# Patient Record
Sex: Male | Born: 1985 | Race: Black or African American | Hispanic: No | Marital: Single | Smoking: Current every day smoker
Health system: Southern US, Community
[De-identification: ages and names within clinical notes are randomized; demographics above are authoritative.]

## PROBLEM LIST (undated history)

## (undated) DIAGNOSIS — K219 Gastro-esophageal reflux disease without esophagitis: Secondary | ICD-10-CM

## (undated) HISTORY — DX: Gastro-esophageal reflux disease without esophagitis: K21.9

---

## 2017-06-27 ENCOUNTER — Emergency Department (HOSPITAL_COMMUNITY): Payer: No Typology Code available for payment source

## 2017-06-27 ENCOUNTER — Other Ambulatory Visit: Payer: Self-pay

## 2017-06-27 ENCOUNTER — Encounter (HOSPITAL_COMMUNITY): Payer: Self-pay

## 2017-06-27 ENCOUNTER — Emergency Department (HOSPITAL_COMMUNITY)
Admission: EM | Admit: 2017-06-27 | Discharge: 2017-06-27 | Disposition: A | Discharge status: Home / Self Care | Payer: No Typology Code available for payment source | Attending: Emergency Medicine | Admitting: Emergency Medicine

## 2017-06-27 DIAGNOSIS — Y999 Unspecified external cause status: Secondary | ICD-10-CM | POA: Diagnosis not present

## 2017-06-27 DIAGNOSIS — M25561 Pain in right knee: Secondary | ICD-10-CM | POA: Insufficient documentation

## 2017-06-27 DIAGNOSIS — S161XXA Strain of muscle, fascia and tendon at neck level, initial encounter: Secondary | ICD-10-CM

## 2017-06-27 DIAGNOSIS — S39012A Strain of muscle, fascia and tendon of lower back, initial encounter: Secondary | ICD-10-CM | POA: Diagnosis not present

## 2017-06-27 DIAGNOSIS — Y929 Unspecified place or not applicable: Secondary | ICD-10-CM | POA: Insufficient documentation

## 2017-06-27 DIAGNOSIS — Y939 Activity, unspecified: Secondary | ICD-10-CM | POA: Diagnosis not present

## 2017-06-27 DIAGNOSIS — S199XXA Unspecified injury of neck, initial encounter: Secondary | ICD-10-CM | POA: Diagnosis present

## 2017-06-27 MED ORDER — METHOCARBAMOL 500 MG PO TABS
500.0000 mg | ORAL_TABLET | Freq: Once | ORAL | Status: AC
  Administered 2017-06-27: 500 mg via ORAL
  Filled 2017-06-27: qty 1

## 2017-06-27 MED ORDER — METHOCARBAMOL 500 MG PO TABS: 500.0000 mg | ORAL_TABLET | Freq: Two times a day (BID) | ORAL | 0 refills | Status: DC

## 2017-06-27 MED ORDER — HYDROCODONE-ACETAMINOPHEN 5-325 MG PO TABS
1.0000 | ORAL_TABLET | Freq: Once | ORAL | Status: AC
  Administered 2017-06-27: 1 via ORAL
  Filled 2017-06-27: qty 1

## 2017-06-27 MED ORDER — NAPROXEN 375 MG PO TABS: 375.0000 mg | ORAL_TABLET | Freq: Two times a day (BID) | ORAL | 0 refills | Status: DC
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
## 2020-03-31 ENCOUNTER — Other Ambulatory Visit: Payer: Self-pay

## 2020-03-31 ENCOUNTER — Ambulatory Visit (HOSPITAL_COMMUNITY)
Admission: EM | Admit: 2020-03-31 | Discharge: 2020-03-31 | Disposition: A | Discharge status: Home / Self Care | Payer: 59 | Attending: Emergency Medicine | Admitting: Emergency Medicine

## 2020-03-31 ENCOUNTER — Encounter (HOSPITAL_COMMUNITY): Payer: Self-pay

## 2020-03-31 DIAGNOSIS — K625 Hemorrhage of anus and rectum: Secondary | ICD-10-CM | POA: Diagnosis present

## 2020-03-31 LAB — CBC
HCT: 53.9 % — ABNORMAL HIGH (ref 39.0–52.0)
Hemoglobin: 18.2 g/dL — ABNORMAL HIGH (ref 13.0–17.0)
MCH: 29.3 pg (ref 26.0–34.0)
MCHC: 33.8 g/dL (ref 30.0–36.0)
MCV: 86.8 fL (ref 80.0–100.0)
Platelets: 204 10*3/uL (ref 150–400)
RBC: 6.21 MIL/uL — ABNORMAL HIGH (ref 4.22–5.81)
RDW: 13.2 % (ref 11.5–15.5)
WBC: 3.8 10*3/uL — ABNORMAL LOW (ref 4.0–10.5)
nRBC: 0 % (ref 0.0–0.2)

## 2020-03-31 MED ORDER — HYDROCORTISONE ACETATE 25 MG RE SUPP: 25.0000 mg | Freq: Two times a day (BID) | RECTAL | 0 refills | Status: DC | PRN

## 2020-03-31 MED ORDER — OMEPRAZOLE 20 MG PO CPDR: 20.0000 mg | DELAYED_RELEASE_CAPSULE | Freq: Every day | ORAL | 0 refills | Status: DC

## 2020-03-31 NOTE — Discharge Instructions (Signed)
I am checking your blood levels to make sure you have not had significant blood loss.  You may use the suppositories provided for concern for internal hemorrhoids.  Drink plenty of water to pass stool regularly and prevent constipation. Limit time spent sitting on the toilet.  I would stop taking any ibuprofen or aleve (naproxen) Start daily omeprazole.  If any worsening of symptoms go to the ER.  If persistent or recurrent please follow up with GI.

## 2020-03-31 NOTE — ED Provider Notes (Signed)
[facility]    CSN: [id number] Arrival date & time: 03/31/20  9417      History   Chief Complaint Chief Complaint  Patient presents with  . Rectal Bleeding    HPI Peter Anderson is a 35 y.o. male.   Vandell Susman presents with complaints of rectal bleeding today. Had some pelvic cramping, passed a stool and noted bright red blood mixed in and within the toilet. Had another stool and there was no blood to the toilet, but did note some bright red blood with wiping. No rectal pain. For the past few days has had some intermittent low back spasms, he has been doing some heavy lifting related to work. No fevers. No nausea or vomiting. Denies any previous similar. No dizziness. No previous colonoscopy. Denies any family history of colon cancer. Over the past few days has taken NSAIDS for the back pain, but denies any regular use of these. Denies any alcohol use. He does smoke. No recent constipation or diarrhea.    ROS per HPI, negative if not otherwise mentioned.      History reviewed. No pertinent past medical history.  There are no problems to display for this patient.   History reviewed. No pertinent surgical history.     Home Medications    Prior to Admission medications   Medication Sig Start Date End Date Taking? Authorizing Provider  hydrocortisone (ANUSOL-HC) 25 MG suppository Place 1 suppository (25 mg total) rectally 2 (two) times daily as needed for hemorrhoids or anal itching. 03/31/20  Yes Augusto Gamble B, NP  omeprazole (PRILOSEC) 20 MG capsule Take 1 capsule (20 mg total) by mouth daily. 03/31/20  Yes Augusto Gamble B, NP  IBUPROFEN PO Take by mouth.    [provider]  methocarbamol (ROBAXIN) 500 MG tablet Take 1 tablet (500 mg total) by mouth 2 (two) times daily. 06/27/17   Maczis, Barth Kirks, PA-C  naproxen (NAPROSYN) 375 MG tablet Take 1 tablet (375 mg total) by mouth 2 (two) times daily with a meal. 06/27/17   Maczis, Barth Kirks, PA-C    Family  History Family History  Family history unknown: Yes    Social History Social History   Tobacco Use  . Smoking status: Never Smoker     Allergies   Patient has no known allergies.   Review of Systems Review of Systems   Physical Exam Triage Vital Signs ED Triage Vitals [03/31/20 1018]  Enc Vitals Group     BP (!) 143/80     Pulse Rate 82     Resp 18     Temp 98.2 F (36.8 C)     Temp Source Oral     SpO2 95 %     Weight      Height      Head Circumference      Peak Flow      Pain Score 2     Pain Loc      Pain Edu?      Excl. in [place]?    No data found.  Updated Vital Signs BP (!) 143/80 (BP Location: Left Arm)   Pulse 82   Temp 98.2 F (36.8 C) (Oral)   Resp 18   SpO2 95%   Visual Acuity Right Eye Distance:   Left Eye Distance:   Bilateral Distance:    Right Eye Near:   Left Eye Near:    Bilateral Near:     Physical Exam Constitutional:  Appearance: He is well-developed.  Cardiovascular:     Rate and Rhythm: Normal rate.  Pulmonary:     Effort: Pulmonary effort is normal.  Abdominal:     Tenderness: There is no abdominal tenderness. There is no right CVA tenderness or left CVA tenderness.  Genitourinary:    Rectum: No external hemorrhoid.     Comments: No external hemorrhoids visible; no external gross blood noted  Musculoskeletal:     Lumbar back: No tenderness or bony tenderness.  Skin:    General: Skin is warm and dry.  Neurological:     Mental Status: He is alert and oriented to person, place, and time.      UC Treatments / Results  Labs (all labs ordered are listed, but only abnormal results are displayed) Labs Reviewed  CBC    EKG   Radiology No results found.  Procedures Procedures (including critical care time)  Medications Ordered in UC Medications - No data to display  Initial Impression / Assessment and Plan / UC Course  I have reviewed the triage vital signs and the nursing notes.  Pertinent labs &  imaging results that were available during my care of the patient were reviewed by me and considered in my medical decision making (see chart for details).     1 episode of bright red blood to stool today. Afebrile. No acute abdominal findings on exam. Has been takign nsaids. No black to stool. Suspect internal hemorrhoid as source, encouraged to stop nsaids and start omeprazole, however. Cbc collected and pending to ensure stable. Gi follow up recommendations discussed. Return precautions provided. Patient verbalized understanding and agreeable to plan.   Final Clinical Impressions(s) / UC Diagnoses   Final diagnoses:  Rectal bleeding     Discharge Instructions     I am checking your blood levels to make sure you have not had significant blood loss.  You may use the suppositories provided for concern for internal hemorrhoids.  Drink plenty of water to pass stool regularly and prevent constipation. Limit time spent sitting on the toilet.  I would stop taking any ibuprofen or aleve (naproxen) Start daily omeprazole.  If any worsening of symptoms go to the ER.  If persistent or recurrent please follow up with GI.    ED Prescriptions    Medication Sig Dispense Auth. Provider   hydrocortisone (ANUSOL-HC) 25 MG suppository Place 1 suppository (25 mg total) rectally 2 (two) times daily as needed for hemorrhoids or anal itching. 12 suppository Jamarea Selner B, NP   omeprazole (PRILOSEC) 20 MG capsule Take 1 capsule (20 mg total) by mouth daily. 30 capsule Zigmund Gottron, NP     PDMP not reviewed this encounter.   Zigmund Gottron, NP 03/31/20 1049

## 2020-03-31 NOTE — ED Triage Notes (Signed)
Pt presents with blood in stool upon waking up this morning with some slight abdominal discomfort.

## 2020-04-07 ENCOUNTER — Encounter: Payer: Self-pay | Admitting: Nurse Practitioner

## 2020-04-20 ENCOUNTER — Encounter: Payer: Self-pay | Admitting: Nurse Practitioner

## 2020-04-20 ENCOUNTER — Other Ambulatory Visit (INDEPENDENT_AMBULATORY_CARE_PROVIDER_SITE_OTHER): Payer: 59

## 2020-04-20 ENCOUNTER — Ambulatory Visit (INDEPENDENT_AMBULATORY_CARE_PROVIDER_SITE_OTHER): Payer: 59 | Admitting: Nurse Practitioner

## 2020-04-20 VITALS — BP 130/70 | HR 88 | Ht 68.0 in | Wt 203.0 lb

## 2020-04-20 DIAGNOSIS — D582 Other hemoglobinopathies: Secondary | ICD-10-CM

## 2020-04-20 DIAGNOSIS — K625 Hemorrhage of anus and rectum: Secondary | ICD-10-CM | POA: Diagnosis not present

## 2020-04-20 DIAGNOSIS — R718 Other abnormality of red blood cells: Secondary | ICD-10-CM | POA: Diagnosis not present

## 2020-04-20 LAB — CBC
HCT: 51.5 % (ref 39.0–52.0)
Hemoglobin: 17.2 g/dL — ABNORMAL HIGH (ref 13.0–17.0)
MCHC: 33.5 g/dL (ref 30.0–36.0)
MCV: 87.1 fl (ref 78.0–100.0)
Platelets: 201 10*3/uL (ref 150.0–400.0)
RBC: 5.91 Mil/uL — ABNORMAL HIGH (ref 4.22–5.81)
RDW: 13.3 % (ref 11.5–15.5)
WBC: 5.4 10*3/uL (ref 4.0–10.5)

## 2020-04-20 LAB — COMPREHENSIVE METABOLIC PANEL
ALT: 70 U/L — ABNORMAL HIGH (ref 0–53)
AST: 44 U/L — ABNORMAL HIGH (ref 0–37)
Albumin: 4.8 g/dL (ref 3.5–5.2)
Alkaline Phosphatase: 59 U/L (ref 39–117)
BUN: 14 mg/dL (ref 6–23)
CO2: 27 mEq/L (ref 19–32)
Calcium: 10.3 mg/dL (ref 8.4–10.5)
Chloride: 103 mEq/L (ref 96–112)
Creatinine, Ser: 1.21 mg/dL (ref 0.40–1.50)
GFR: 77.85 mL/min (ref >60.00)
Glucose, Bld: 89 mg/dL (ref 70–99)
Potassium: 4.3 mEq/L (ref 3.5–5.1)
Sodium: 138 mEq/L (ref 135–145)
Total Bilirubin: 0.4 mg/dL (ref 0.2–1.2)
Total Protein: 7.9 g/dL (ref 6.0–8.3)

## 2020-04-20 LAB — FERRITIN: Ferritin: 132 ng/mL (ref 22.0–322.0)

## 2020-04-20 LAB — IRON: Iron: 76 ug/dL (ref 42–165)

## 2020-04-20 MED ORDER — PLENVU 140 G PO SOLR: 1.0000 | ORAL | 0 refills | Status: DC

## 2020-04-20 NOTE — Progress Notes (Signed)
04/20/2020 Peter Anderson [id number] 12/18/1985   CHIEF COMPLAINT: Rectal bleeding  HISTORY OF PRESENT ILLNESS: Peter Anderson is a 35 year old male with no significant past medical history.  No past surgical history.  He presents today for further evaluation regarding rectal bleeding.  He reported having abdominal cramping and lower back pain.  He went to the bathroom and passed a normal formed brown bowel movement with a moderate amount of bright red blood seen on the toilet tissue and in the toilet water which was separate from the stool.  No associated anorectal pain.  He denied straining to pass a bowel movement.  No prior constipation.  He was concerned regarding the rectal bleeding so he presented to [facility] urgent care center on 03/31/2020.  Labs at the urgent care showed a WBC 3.8.  Hemoglobin 18.2 and hematocrit 53.9.  Rectal exam completed by the urgent care NP was negative for external hemorrhoids or blood in the rectum.  He was prescribed Anusol suppositories twice daily and Omeprazole 20 mg daily.  Currently, he denies having any upper or lower abdominal pain.  He is passing a normal formed brown bowel movement daily without straining.  No further rectal bleeding since the episode on 1/12.  No dysphagia.  He has infrequent heartburn, he reported having 4 episodes of heartburn over the past 3 months.  He took ibuprofen 200 mg 2 tabs p.o. twice daily for 2 or 3 days for headaches prior to the episode of rectal bleeding.  He denies routine NSAID use.  No fatigue.  His weight is stable.  No fever, sweats or chills.  No family history of colorectal cancer.  He denies ever having a colonoscopy.  Father had stomach ulcers and liver cancer.  I reviewed his labs in the urgent care 03/31/2020 showed a low WBC count 3.8 and elevated hemoglobin 8.2.  He denied being dehydrated at the time of this lab collection.  He stated he has a prior history of low WBCs and elevated RBCs.  He denies ever seeing a  hematologist.  No family history of hemochromatosis.  Social History: He is single. He smokes cigarettes 4 to 5 cigarettes daily, off and on 20 years. He drinks hard liquor one or two shots once monthly. Denies drug use.   Family History: Mother age 16. Father deceased age 53 liver cancer, stomach ulcers. 4 sister and 1 brother are all healthy. Maternal grandmother died ovarian cancer. Paternal grandmother with DM.    No Known Allergies    Outpatient Encounter Medications as of 04/20/2020  Medication Sig  . hydrocortisone (ANUSOL-HC) 25 MG suppository Place 1 suppository (25 mg total) rectally 2 (two) times daily as needed for hemorrhoids or anal itching.  Marland Kitchen omeprazole (PRILOSEC) 20 MG capsule Take 1 capsule (20 mg total) by mouth daily.  . [DISCONTINUED] IBUPROFEN PO Take by mouth.  . [DISCONTINUED] methocarbamol (ROBAXIN) 500 MG tablet Take 1 tablet (500 mg total) by mouth 2 (two) times daily. (Patient not taking: Reported on 04/20/2020)  . [DISCONTINUED] naproxen (NAPROSYN) 375 MG tablet Take 1 tablet (375 mg total) by mouth 2 (two) times daily with a meal. (Patient not taking: Reported on 04/20/2020)   No facility-administered encounter medications on file as of 04/20/2020.     REVIEW OF SYSTEMS:  Gen: + fatigue. Denies fever, sweats or chills. No weight loss.  CV: Denies chest pain, palpitations or edema. Resp: Denies cough, shortness of breath of hemoptysis.  GI: Denies heartburn, dysphagia, stomach or lower  abdominal pain. No diarrhea or constipation.  GU : Denies urinary burning, blood in urine, increased urinary frequency or incontinence. MS: Denies joint pain, muscles aches or weakness. Derm: Denies rash, itchiness, skin lesions or unhealing ulcers. Psych: Denies depression, anxiety, memory loss, suicidal ideation and confusion. Heme: Denies bruising, bleeding. Neuro:  + Headaches. No dizziness or paresthesias. Endo:  Denies any problems with DM, thyroid or adrenal  function.  PHYSICAL EXAM: BP 130/70   Pulse 88   Ht 5\' 8"  (1.727 m)   Wt 203 lb (92.1 kg)   SpO2 98%   BMI 30.87 kg/m  General: Well developed 35 year old male in no acute distress. Head: Normocephalic and atraumatic. Eyes:  Sclerae non-icteric, conjunctive pink. Ears: Normal auditory acuity. Mouth: Dentition intact. No ulcers or lesions.  Neck: Supple, no lymphadenopathy or thyromegaly.  Lungs: Clear bilaterally to auscultation without wheezes, crackles or rhonchi. Heart: Regular rate and rhythm. No murmur, rub or gallop appreciated.  Abdomen: Soft, nontender, non distended. No masses. No hepatosplenomegaly. Normoactive bowel sounds x 4 quadrants.  Rectal: No external hemorrhoids or anal fissure.  No significant internal hemorrhoids palpated on exam, no mass.  No blood or stool in the rectal vault.  CMA Melissa present during exam. Musculoskeletal: + back pain. Skin: Warm and dry. No rash or lesions on visible extremities. Extremities: No edema. Neurological: Alert oriented x 4, no focal deficits.  Psychological:  Alert and cooperative. Normal mood and affect.  ASSESSMENT AND PLAN:  38.  35 year old male with 1 episode of bright red blood per the rectum.  No obvious source of rectal bleeding on exam today. -Diagnostic colonoscopy benefits and risks discussed including risk with sedation, risk of bleeding, perforation and infection  -Further follow-up to be determined after colonoscopy completed -Benefiber 1 tablespoon daily.  Miralax Q HS as needed to avoid constipation/straining. -Patient to call our office if rectal bleeding recurs  2.  Elevated RBC/HG/HCT levels -Repeat CBC, iron and ferritin level  3.  Infrequent heartburn. No upper abdominal pain.  -Avoid NSAIDs -Okay to reduce Omeprazole 20 mg every other day then discontinue in a few weeks        CC:  No ref. provider found

## 2020-04-20 NOTE — Patient Instructions (Addendum)
If you are age 35 or younger, your body mass index should be between 19-25. Your Body mass index is 30.87 kg/m. If this is out of the aformentioned range listed, please consider follow up with your Primary Care Provider.   PROCEDURES:  You have been scheduled for a colonoscopy. Please follow the written instructions given to you at your visit today. Please pick up your prep supplies at the pharmacy within the next 1-3 days. If you use inhalers (even only as needed), please bring them with you on the day of your procedure.  LABS:   Your provider has requested that you go to the basement level for lab work before leaving today. Press "B" on the elevator. The lab is located at the first door on the left as you exit the elevator.  HEALTHCARE LAWS AND MY CHART RESULTS: Due to recent changes in healthcare laws, you may see the results of your imaging and laboratory studies on MyChart before your provider has had a chance to review them.   We understand that in some cases there may be results that are confusing or concerning to you. Not all laboratory results come back in the same time frame and the provider may be waiting for multiple results in order to interpret others.  Please give [place] 48 hours in order for your provider to thoroughly review all the results before contacting the office for clarification of your results.   OVER THE COUNTER MEDICATION  Please purchase the following medications over the counter and take as directed:  Benefiber- 1 tablespoon daily. Miralax. Dissolve one capful in 8 ounces of water and drink before bed.  It was great seeing you today!  Thank you for entrusting me with your care and choosing [facility].  Noralyn Pick, CRNP

## 2020-04-22 ENCOUNTER — Other Ambulatory Visit: Payer: Self-pay | Admitting: Nurse Practitioner

## 2020-04-22 ENCOUNTER — Telehealth: Payer: Self-pay

## 2020-04-22 ENCOUNTER — Telehealth: Payer: Self-pay | Admitting: Physician Assistant

## 2020-04-22 DIAGNOSIS — D582 Other hemoglobinopathies: Secondary | ICD-10-CM

## 2020-04-22 DIAGNOSIS — R718 Other abnormality of red blood cells: Secondary | ICD-10-CM

## 2020-04-22 DIAGNOSIS — R7989 Other specified abnormal findings of blood chemistry: Secondary | ICD-10-CM

## 2020-04-22 DIAGNOSIS — K625 Hemorrhage of anus and rectum: Secondary | ICD-10-CM

## 2020-04-22 NOTE — Telephone Encounter (Signed)
-----   Message from Noralyn Pick, NP sent at 04/22/2020  7:35 AM EST -----   ----- Message ----- From: Doran Stabler, MD Sent: 04/22/2020   7:02 AM EST To: Noralyn Pick, NP    ----- Message ----- From: Noralyn Pick, NP Sent: 04/20/2020   1:00 PM EST To: Doran Stabler, MD

## 2020-04-22 NOTE — Progress Notes (Signed)
Kelly, pls contact the patient and let him know his red blood cell counts remain elevated with normal iron levels. Dr. Loletha Carrow has reviewed his labs and he recommends for the patient to see a hematologist to rule out polycythemia vera which can cause elevated red blood cell counts. Please enter hematology referral. Please let the patient know smoking cessation is also recommended and he should follow up with his PCP for further smoking cessation management. THX

## 2020-04-22 NOTE — Progress Notes (Signed)
____________________________________________________________  Attending physician addendum:  Thank you for sending this case to me. I have reviewed the entire note and agree with the plan.  His hemoglobin is still elevated at 17.2 with normal iron and ferritin.  Probably from smoking, but I recommend a hematology referral to rule out Polycythemia vera as well as smoking cessation.  Wilfrid Lund, MD  ____________________________________________________________

## 2020-04-22 NOTE — Telephone Encounter (Signed)
Spoke to patient this morning to inform him of recent lab results and Dr Corena Pilgrim recommendations for hematology referral. Referral sent through [place]. Patient will contact his PCP regarding smoking cessation. All questions answered. Patient voiced understanding.

## 2020-04-22 NOTE — Telephone Encounter (Signed)
Received a new hem referral from Dr. Loletha Carrow for elevated hgb. Mr. Peter Anderson has been cld and scheduled to see Cassie on 2/10 at 1:30pm w/labs at 1pm. Pt aware toa rrive 20 minutes early.

## 2020-04-28 ENCOUNTER — Other Ambulatory Visit: Payer: Self-pay | Admitting: Physician Assistant

## 2020-04-28 DIAGNOSIS — D751 Secondary polycythemia: Secondary | ICD-10-CM

## 2020-04-29 ENCOUNTER — Telehealth: Payer: Self-pay

## 2020-04-29 ENCOUNTER — Inpatient Hospital Stay: Payer: No Typology Code available for payment source

## 2020-04-29 ENCOUNTER — Inpatient Hospital Stay: Payer: No Typology Code available for payment source | Admitting: Physician Assistant

## 2020-04-29 NOTE — Telephone Encounter (Signed)
Call attempt for missed appointment

## 2020-06-04 ENCOUNTER — Telehealth: Payer: Self-pay | Admitting: Gastroenterology

## 2020-06-04 NOTE — Telephone Encounter (Signed)
Hi Dr. Loletha Carrow, this patient just called to cancel procedure that was scheduled on 06/07/20 because he had a family emergency and has to go out of town. Patient has rescheduled to 06/09/20 at 3:00pm. Thank you.

## 2020-06-07 ENCOUNTER — Encounter: Payer: 59 | Admitting: Gastroenterology

## 2020-06-09 ENCOUNTER — Encounter: Payer: 59 | Admitting: Gastroenterology

## 2020-08-10 ENCOUNTER — Telehealth: Payer: Self-pay | Admitting: *Deleted

## 2020-08-10 NOTE — Telephone Encounter (Signed)
Dr. Loletha Carrow,  This pt was seen by [facility] on 04-20-20 for rectal bleeding.  He has rescheduled his colonoscopy twice and is now scheduled for 09-03-20.  Per our PV policy, we are to check with the MD if the procedure is greater than 90 days from OV.  I am just checking to make sure you are ok proceeding as scheduled.  Thanks, [employer]

## 2020-08-10 NOTE — Telephone Encounter (Signed)
noted 

## 2020-08-10 NOTE — Telephone Encounter (Signed)
It is okay to proceed as scheduled.  However, if this patient cancels or reschedules the upcoming procedure, then he must make an office visit to be reevaluated before any other procedures will be planned.  - HD

## 2020-08-20 ENCOUNTER — Telehealth: Payer: Self-pay

## 2020-08-20 ENCOUNTER — Ambulatory Visit (AMBULATORY_SURGERY_CENTER): Payer: 59

## 2020-08-20 ENCOUNTER — Other Ambulatory Visit: Payer: Self-pay

## 2020-08-20 VITALS — Ht 68.0 in | Wt 196.0 lb

## 2020-08-20 DIAGNOSIS — K625 Hemorrhage of anus and rectum: Secondary | ICD-10-CM

## 2020-08-20 MED ORDER — PLENVU 140 G PO SOLR: 1.0000 | ORAL | 0 refills | Status: DC

## 2020-08-20 NOTE — Telephone Encounter (Signed)
Left message for patient to call back to the office for pre visit appt;

## 2020-08-20 NOTE — Telephone Encounter (Signed)
PV RN was able to reach patient and completed pre visit appt

## 2020-08-20 NOTE — Progress Notes (Signed)
Pre visit completed via phone call; patient verified name, DOB, and address; No egg or soy allergy known to patient  No issues with past sedation with any surgeries or procedures---no past surgeries Patient denies ever being told they had issues or difficulty with intubation - no past surgeries No FH of Malignant Hyperthermia No diet pills per patient No home 02 use per patient  No blood thinners per patient  Pt denies issues with constipation - patient did report he is still having rectal bleeding off/on since last OV No A fib or A flutter  EMMI video via [place] 19 guidelines implemented in PV today with Pt and RN   Due to the COVID-19 pandemic we are asking patients to follow certain guidelines.  Pt aware of COVID protocols and [facility] guidelines   Coupon given to pt in PV today, Code to Pharmacy and NO PA's for preps discussed with pt In PV today  Discussed with pt there will be an out-of-pocket cost for prep and that varies from $0 to 70 dollars   Patient advised that if he cancelled his procedure, he would have to be seen in the office for his procedure to be rescheduled - patient verbalized understanding

## 2020-09-03 ENCOUNTER — Ambulatory Visit (AMBULATORY_SURGERY_CENTER): Payer: Self-pay | Admitting: Gastroenterology

## 2020-09-03 ENCOUNTER — Other Ambulatory Visit: Payer: Self-pay

## 2020-09-03 ENCOUNTER — Telehealth: Payer: Self-pay | Admitting: Gastroenterology

## 2020-09-03 ENCOUNTER — Encounter: Payer: Self-pay | Admitting: Gastroenterology

## 2020-09-03 VITALS — BP 126/80 | HR 83 | Temp 98.2°F | Resp 13 | Ht 68.0 in | Wt 196.0 lb

## 2020-09-03 DIAGNOSIS — D123 Benign neoplasm of transverse colon: Secondary | ICD-10-CM

## 2020-09-03 DIAGNOSIS — K625 Hemorrhage of anus and rectum: Secondary | ICD-10-CM

## 2020-09-03 DIAGNOSIS — K648 Other hemorrhoids: Secondary | ICD-10-CM

## 2020-09-03 MED ORDER — SODIUM CHLORIDE 0.9 % IV SOLN: 500.0000 mL | Freq: Once | INTRAVENOUS | Status: DC

## 2020-09-03 NOTE — Progress Notes (Signed)
Called to room to assist during endoscopic procedure.  Patient ID and intended procedure confirmed with present staff. Received instructions for my participation in the procedure from the performing physician.  

## 2020-09-03 NOTE — Progress Notes (Signed)
Pt's states no medical or surgical changes since previsit or office visit. 

## 2020-09-03 NOTE — Patient Instructions (Signed)
Resume previous diet and medications. Awaiting pathology results. Repeat colonoscopy based on pathology results.  YOU HAD AN ENDOSCOPIC PROCEDURE TODAY AT [facility]:   Refer to the procedure report that was given to you for any specific questions about what was found during the examination.  If the procedure report does not answer your questions, please call your gastroenterologist to clarify.  If you requested that your care partner not be given the details of your procedure findings, then the procedure report has been included in a sealed envelope for you to review at your convenience later.  YOU SHOULD EXPECT: Some feelings of bloating in the abdomen. Passage of more gas than usual.  Walking can help get rid of the air that was put into your GI tract during the procedure and reduce the bloating. If you had a lower endoscopy (such as a colonoscopy or flexible sigmoidoscopy) you may notice spotting of blood in your stool or on the toilet paper. If you underwent a bowel prep for your procedure, you may not have a normal bowel movement for a few days.  Please Note:  You might notice some irritation and congestion in your nose or some drainage.  This is from the oxygen used during your procedure.  There is no need for concern and it should clear up in a day or so.  SYMPTOMS TO REPORT IMMEDIATELY:  Following lower endoscopy (colonoscopy or flexible sigmoidoscopy):  Excessive amounts of blood in the stool  Significant tenderness or worsening of abdominal pains  Swelling of the abdomen that is new, acute  Fever of 100F or higher   For urgent or emergent issues, a gastroenterologist can be reached at any hour by calling [phone]. Do not use MyChart messaging for urgent concerns.    DIET:  We do recommend a small meal at first, but then you may proceed to your regular diet.  Drink plenty of fluids but you should avoid alcoholic beverages for 24 hours.  ACTIVITY:  You should  plan to take it easy for the rest of today and you should NOT DRIVE or use heavy machinery until tomorrow (because of the sedation medicines used during the test).    FOLLOW UP: Our staff will call the number listed on your records 48-72 hours following your procedure to check on you and address any questions or concerns that you may have regarding the information given to you following your procedure. If we do not reach you, we will leave a message.  We will attempt to reach you two times.  During this call, we will ask if you have developed any symptoms of COVID 19. If you develop any symptoms (ie: fever, flu-like symptoms, shortness of breath, cough etc.) before then, please call [phone].  If you test positive for Covid 19 in the 2 weeks post procedure, please call and report this information to [place].    If any biopsies were taken you will be contacted by phone or by letter within the next 1-3 weeks.  Please call us at [phone] if you have not heard about the biopsies in 3 weeks.    SIGNATURES/CONFIDENTIALITY: You and/or your care partner have signed paperwork which will be entered into your electronic medical record.  These signatures attest to the fact that that the information above on your After Visit Summary has been reviewed and is understood.  Full responsibility of the confidentiality of this discharge information lies with you and/or your care-partner.

## 2020-09-03 NOTE — Telephone Encounter (Signed)
Inbound call from patient. Colon procedure 6/17. States have been vomiting mucus and blood. Best contact number [phone]

## 2020-09-03 NOTE — Op Note (Signed)
Peter Anderson Patient Name: Peter Anderson Procedure Date: 09/03/2020 9:05 AM MRN: [id number] Endoscopist: Mallie Mussel L. Loletha Carrow , MD Age: 35 Referring MD:  Date of Birth: 1985-05-04 Gender: Male Account #: [id number] Procedure:                Colonoscopy Indications:              Rectal bleeding (self-limited, months ago) Medicines:                Monitored Anesthesia Care Procedure:                Pre-Anesthesia Assessment:                           - Prior to the procedure, a History and Physical                            was performed, and patient medications and                            allergies were reviewed. The patient's tolerance of                            previous anesthesia was also reviewed. The risks                            and benefits of the procedure and the sedation                            options and risks were discussed with the patient.                            All questions were answered, and informed consent                            was obtained. Prior Anticoagulants: The patient has                            taken no previous anticoagulant or antiplatelet                            agents. ASA Grade Assessment: II - A patient with                            mild systemic disease. After reviewing the risks                            and benefits, the patient was deemed in                            satisfactory condition to undergo the procedure.                           After obtaining informed consent, the colonoscope  was passed under direct vision. Throughout the                            procedure, the patient's blood pressure, pulse, and                            oxygen saturations were monitored continuously. The                            Olympus CF-HQ190 [id number]) Colonoscope was                            introduced through the anus and advanced to the the                            cecum, identified by  appendiceal orifice and                            ileocecal valve. The colonoscopy was performed with                            difficulty due to a tortuous colon and abdominal                            breathing. The patient tolerated the procedure                            (required continuous upper airway support). , after                            lavage The quality of the bowel preparation was                            good after lavage. The terminal ileum, ileocecal                            valve, appendiceal orifice, and rectum were                            photographed. Scope In: 9:14:01 AM Scope Out: 9:31:13 AM Scope Withdrawal Time: 0 hours 10 minutes 56 seconds  Total Procedure Duration: 0 hours 17 minutes 12 seconds  Findings:                 The perianal and digital rectal examinations were                            normal.                           The sigmoid colon was tortuous.                           A 3 mm polyp was found in the distal transverse  colon. The polyp was sessile. The polyp was removed                            with a cold snare. Resection and retrieval were                            complete.                           Internal hemorrhoids were found. The hemorrhoids                            were small.                           The exam was otherwise without abnormality on                            direct and retroflexion views. Complications:            No immediate complications. Estimated Blood Loss:     Estimated blood loss was minimal. Impression:               - Tortuous colon.                           - One 3 mm polyp in the distal transverse colon,                            removed with a cold snare. Resected and retrieved.                           - Internal hemorrhoids.                           - The examination was otherwise normal on direct                            and retroflexion  views. Recommendation:           - Patient has a contact number available for                            emergencies. The signs and symptoms of potential                            delayed complications were discussed with the                            patient. Return to normal activities tomorrow.                            Written discharge instructions were provided to the                            patient.                           -  Resume previous diet.                           - Continue present medications.                           - Await pathology results.                           - Repeat colonoscopy is recommended for                            surveillance. The colonoscopy date will be                            determined after pathology results from today's                            exam become available for review.                           - High fiber diet for regularity, avoid straining                            for bowel movements. Jeric Slagel L. Loletha Carrow, MD 09/03/2020 9:37:49 AM This report has been signed electronically.

## 2020-09-03 NOTE — Telephone Encounter (Signed)
Spoke with pt and he said he was not vomiting up blood, but coughing up blood streaked mucous.  I spoke with Dr. Loletha Carrow and he said he was not surprised.  The pt's airway needed support throughout the procedure.  The CRNA suctioned the pt while the procedure was being preformed.  This can cause throat irritation.  Per Dr. Loletha Carrow, pt to try warm, salt water gargles every few hours and add cough drop to coat and sooth and decrease urge to cough.  Pt said he had no other sx and will do as Dr. Ventura Bruns Recommended.  He will call the number on his AVS if sx worsen.  maw

## 2020-09-03 NOTE — Progress Notes (Signed)
pt tolerated well. VSS. awake and to recovery. Report given to RN.  

## 2020-09-07 ENCOUNTER — Telehealth: Payer: Self-pay

## 2020-09-07 NOTE — Telephone Encounter (Signed)
  Follow up Call-  Call back number 09/03/2020  Post procedure Call Back phone  # [phone]  Permission to leave phone message Yes  Some recent data might be hidden     Patient questions:  Do you have a fever, pain , or abdominal swelling? No. Pain Score  0 *  Have you tolerated food without any problems? Yes.    Have you been able to return to your normal activities? Yes.    Do you have any questions about your discharge instructions: Diet   No. Medications  No. Follow up visit  No.  Do you have questions or concerns about your Care? No.  Actions: * If pain score is 4 or above: No action needed, pain <4. Have you developed a fever since your procedureno  2.   Have you had an respiratory symptoms (SOB or cough) since your procedure? no  3.   Have you tested positive for COVID 19 since your procedure no  4.   Have you had any family members/close contacts diagnosed with the COVID 19 since your procedure?  no   If yes to any of these questions please route to Joylene John, RN and Joella Prince, RN

## 2020-09-07 NOTE — Telephone Encounter (Signed)
First post procedure follow up call, no answer 

## 2020-09-09 ENCOUNTER — Encounter: Payer: Self-pay | Admitting: Gastroenterology

## 2020-12-31 ENCOUNTER — Ambulatory Visit (HOSPITAL_BASED_OUTPATIENT_CLINIC_OR_DEPARTMENT_OTHER): Payer: 59 | Admitting: Family Medicine

## 2020-12-31 ENCOUNTER — Encounter (HOSPITAL_BASED_OUTPATIENT_CLINIC_OR_DEPARTMENT_OTHER): Payer: Self-pay | Admitting: Family Medicine

## 2020-12-31 ENCOUNTER — Other Ambulatory Visit: Payer: Self-pay

## 2020-12-31 VITALS — BP 110/78 | HR 65 | Ht 68.0 in | Wt 205.0 lb

## 2020-12-31 DIAGNOSIS — F17200 Nicotine dependence, unspecified, uncomplicated: Secondary | ICD-10-CM

## 2020-12-31 DIAGNOSIS — Z86010 Personal history of colonic polyps: Secondary | ICD-10-CM | POA: Diagnosis not present

## 2020-12-31 DIAGNOSIS — Z860101 Personal history of adenomatous and serrated colon polyps: Secondary | ICD-10-CM

## 2020-12-31 DIAGNOSIS — Z0000 Encounter for general adult medical examination without abnormal findings: Secondary | ICD-10-CM

## 2020-12-31 NOTE — Progress Notes (Signed)
New Patient Office Visit  Subjective:  Patient ID: Peter Anderson, male    DOB: March 20, 1986  Age: 35 y.o. MRN: [id number]  CC:  Chief Complaint  Patient presents with   Establish Care    No Prior PCP - No Concerns or complaints     HPI Peter Anderson is a 35 year old male presenting to establish in clinic.  He has no current concerns today.  Adenomatous polyp: Reports he developed issues with rectal bleeding and due to family history of various cancers, denies any colon cancer family history, patient had further evaluation with gastroenterologist.  Decision was made to proceed with colonoscopy.  On colonoscopy, polyp was found and biopsied.  Pathology revealed that polyp was adenomatous.  Patient was instructed that this type of polyp is precancerous and recommendation from gastroenterology was for repeat screening colonoscopy in 7 years.  He denies any recurrent issues with rectal bleeding.  Tobacco use: Currently smoking cigarettes, reports about one quarter of a pack per day.  Has been smoking for about 20 years.  Indicates that he is currently working towards smoking cessation with gradual reduction in daily smoking.  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  He was recently started back with exercising for general wellness.  Does not take some family history of chronic conditions including diabetes and blood pressure issues, primarily in a couple grandparents.  Past Medical History:  Diagnosis Date   GERD (gastroesophageal reflux disease)    OTC PRN meds    History reviewed. No pertinent surgical history.  Family History  Problem Relation Age of Onset   Liver cancer Father 30   Cancer Maternal Grandmother        ovarian   Colon cancer Neg Hx    Esophageal cancer Neg Hx    Colon polyps Neg Hx    Rectal cancer Neg Hx    Stomach cancer Neg Hx     Social History   Socioeconomic History   Marital status: Single    Spouse name: Not on file   Number of children: Not on file    Years of education: Not on file   Highest education level: Not on file  Occupational History   Not on file  Tobacco Use   Smoking status: Every Day    Packs/day: 0.25    Types: Cigarettes   Smokeless tobacco: Never  Vaping Use   Vaping Use: Never used  Substance and Sexual Activity   Alcohol use: Not Currently    Alcohol/week: 1.0 standard drink    Types: 1 Standard drinks or equivalent per week   Drug use: Yes    Types: Marijuana   Sexual activity: Yes    Birth control/protection: None  Other Topics Concern   Not on file  Social History Narrative   Not on file   Social Determinants of Health   Financial Resource Strain: Not on file  Food Insecurity: Not on file  Transportation Needs: Not on file  Physical Activity: Not on file  Stress: Not on file  Social Connections: Not on file  Intimate Partner Violence: Not on file    Objective:   Today's Vitals: BP 110/78   Pulse 65   Ht 5\' 8"  (1.727 m)   Wt 205 lb (93 kg)   SpO2 99%   BMI 31.17 kg/m   Physical Exam  35 year old male in no acute distress Cardiovascular exam with regular rate and rhythm, no murmurs appreciated Lungs clear to auscultation bilaterally  Assessment & Plan:  Problem List Items Addressed This Visit       Other   History of adenomatous polyp of colon - Primary    Result of colonoscopy with GI revealed finding of polyp which on pathology was adenomatous Recommendation was for patient to have repeat colonoscopy in 7 years for monitoring Patient advised to monitor for any new symptoms such as rectal bleeding for which he should return to GI for further evaluation      Tobacco use disorder    Currently smoking about a quarter of a pack per day Patient does have interest in quitting, reports that he is gradually decreasing daily tobacco use Encouraged to continue with working towards cessation Advised that if there is any assistance we can provide, to let us know and we can discuss further  options including nicotine replacement therapies, pharmacotherapy      Other Visit Diagnoses     Laboratory tests ordered as part of a complete physical exam (CPE)       Relevant Orders   CBC with Differential/Platelet   Comprehensive metabolic panel   Lipid panel   Hemoglobin A1c       Outpatient Encounter Medications as of 12/31/2020  Medication Sig   [DISCONTINUED] Multiple Vitamin (MULTIVITAMIN) tablet Take 1 tablet by mouth daily.   [DISCONTINUED] 0.9 %  sodium chloride infusion    No facility-administered encounter medications on file as of 12/31/2020.    Follow-up: Return in about 6 months (around 07/01/2021).  Plan for follow-up in about 4 to 6 months for CPE with nurse visit 1 week prior to complete labs  Nathian Stencil J [place], MD

## 2020-12-31 NOTE — Assessment & Plan Note (Signed)
Currently smoking about a quarter of a pack per day Patient does have interest in quitting, reports that he is gradually decreasing daily tobacco use Encouraged to continue with working towards cessation Advised that if there is any assistance we can provide, to let us know and we can discuss further options including nicotine replacement therapies, pharmacotherapy

## 2020-12-31 NOTE — Patient Instructions (Signed)
  Medication Instructions:  Your physician recommends that you continue on your current medications as directed. Please refer to the Current Medication list given to you today. --If you need a refill on any your medications before your next appointment, please call your pharmacy first. If no refills are authorized on file call the office.-- Follow-Up: Your next appointment:   Your physician recommends that you schedule a follow-up appointment in: 4-6 MONTHS for CPE with Dr. [place]  You will receive a text message or e-mail with a link to a survey about your care and experience with [place] today! We would greatly appreciate your feedback!   Thanks for letting us be apart of your health journey!!  Primary Care and Sports Medicine   Dr. Arlina Robes [place]   We encourage you to activate your patient portal called "MyChart".  Sign up information is provided on this After Visit Summary.  MyChart is used to connect with patients for Virtual Visits (Telemedicine).  Patients are able to view lab/test results, encounter notes, upcoming appointments, etc.  Non-urgent messages can be sent to your provider as well. To learn more about what you can do with MyChart, please visit --  [email or web address].

## 2020-12-31 NOTE — Assessment & Plan Note (Signed)
Result of colonoscopy with GI revealed finding of polyp which on pathology was adenomatous Recommendation was for patient to have repeat colonoscopy in 7 years for monitoring Patient advised to monitor for any new symptoms such as rectal bleeding for which he should return to GI for further evaluation

## 2021-07-01 ENCOUNTER — Encounter (HOSPITAL_BASED_OUTPATIENT_CLINIC_OR_DEPARTMENT_OTHER): Payer: 59 | Admitting: Family Medicine

## 2021-08-09 ENCOUNTER — Encounter (HOSPITAL_BASED_OUTPATIENT_CLINIC_OR_DEPARTMENT_OTHER): Payer: Self-pay | Admitting: Family Medicine
